# Patient Record
Sex: Male | Born: 1960 | Hispanic: Yes | Marital: Married | Smoking: Never smoker
Health system: Southern US, Community
[De-identification: ages and names within clinical notes are randomized; demographics above are authoritative.]

## PROBLEM LIST (undated history)

## (undated) DIAGNOSIS — J45909 Unspecified asthma, uncomplicated: Secondary | ICD-10-CM

## (undated) HISTORY — PX: ABDOMINAL SURGERY: SHX537

---

## 2017-07-17 DIAGNOSIS — Z905 Acquired absence of kidney: Secondary | ICD-10-CM | POA: Insufficient documentation

## 2019-03-08 ENCOUNTER — Emergency Department (INDEPENDENT_AMBULATORY_CARE_PROVIDER_SITE_OTHER): Payer: BLUE CROSS/BLUE SHIELD

## 2019-03-08 ENCOUNTER — Emergency Department (INDEPENDENT_AMBULATORY_CARE_PROVIDER_SITE_OTHER)
Admission: EM | Admit: 2019-03-08 | Discharge: 2019-03-08 | Disposition: A | Discharge status: Home / Self Care | Payer: BLUE CROSS/BLUE SHIELD | Source: Home / Self Care

## 2019-03-08 ENCOUNTER — Other Ambulatory Visit: Payer: Self-pay

## 2019-03-08 DIAGNOSIS — R05 Cough: Secondary | ICD-10-CM | POA: Diagnosis not present

## 2019-03-08 DIAGNOSIS — R062 Wheezing: Secondary | ICD-10-CM

## 2019-03-08 DIAGNOSIS — J4521 Mild intermittent asthma with (acute) exacerbation: Secondary | ICD-10-CM

## 2019-03-08 HISTORY — DX: Unspecified asthma, uncomplicated: J45.909

## 2019-03-08 MED ORDER — CETIRIZINE HCL 10 MG PO TABS: 10.0000 mg | ORAL_TABLET | Freq: Every day | ORAL | 0 refills | Status: DC

## 2019-03-08 MED ORDER — ALBUTEROL SULFATE HFA 108 (90 BASE) MCG/ACT IN AERS: 1.0000 | INHALATION_SPRAY | Freq: Four times a day (QID) | RESPIRATORY_TRACT | 0 refills | Status: DC | PRN

## 2019-03-08 MED ORDER — PREDNISONE 20 MG PO TABS: ORAL_TABLET | ORAL | 0 refills | Status: DC

## 2019-03-08 MED ORDER — METHYLPREDNISOLONE SODIUM SUCC 40 MG IJ SOLR
80.0000 mg | Freq: Once | INTRAMUSCULAR | Status: AC
  Administered 2019-03-08: 18:00:00 80 mg via INTRAMUSCULAR

## 2019-03-08 NOTE — ED Triage Notes (Signed)
Pt was seen about 1 month ago for cough, given medications for asthma.  About 2 weeks ago became worse.  Denies fever, nausea, vomiting.  Coughs at night mostly, but has gotten worse during the day.  Productive cough

## 2019-03-08 NOTE — ED Provider Notes (Signed)
[facility]    CSN: [id number] Arrival date & time: 03/08/19  1704     History   Chief Complaint Chief Complaint  Patient presents with  . Cough    HPI Frank Burke is a 58 y.o. male.   HPI  Frank Burke is a 58 y.o. male presenting to UC with c/o gradually worsening cough, congestion, chest tightness and wheeze.  He was seen at a different urgent care about 1 month ago and was prescribed prednisone, inhalers and claritin. He was improving until about 2 weeks ago. He ran out of the medication in his inhaler.  Denies fever, chills, HA, sore throat, body aches. Denies n/v/d. No known sick contacts or recent travel. Pt reports needing inhalers around the same time every year due to his allergies.     Past Medical History:  Diagnosis Date  . Asthma     There are no active problems to display for this patient.   History reviewed. No pertinent surgical history.     Home Medications    Prior to Admission medications   Medication Sig Start Date End Date Taking? Authorizing Provider  albuterol (VENTOLIN HFA) 108 (90 Base) MCG/ACT inhaler Inhale 1-2 puffs into the lungs every 6 (six) hours as needed for wheezing or shortness of breath. 03/08/19   Lurene Shadow, PA-C  cetirizine (ZYRTEC) 10 MG tablet Take 1 tablet (10 mg total) by mouth daily. 03/08/19   Lurene Shadow, PA-C  predniSONE (DELTASONE) 20 MG tablet 3 tabs po day one, then 2 po daily x 4 days 03/08/19   Lurene Shadow, PA-C    Family History History reviewed. No pertinent family history.  Social History Social History   Tobacco Use  . Smoking status: Never Smoker  Substance Use Topics  . Alcohol use: Not on file  . Drug use: Not on file     Allergies   Patient has no known allergies.   Review of Systems Review of Systems  Constitutional: Negative for chills and fever.  HENT: Positive for congestion. Negative for ear pain, sore throat, trouble swallowing and voice change.   Respiratory:  Positive for cough, chest tightness, shortness of breath and wheezing.   Cardiovascular: Negative for chest pain and palpitations.  Gastrointestinal: Negative for abdominal pain, diarrhea, nausea and vomiting.  Musculoskeletal: Negative for arthralgias, back pain and myalgias.  Skin: Negative for rash.     Physical Exam Triage Vital Signs ED Triage Vitals  Enc Vitals Group     BP      Pulse      Resp      Temp      Temp src      SpO2      Weight      Height      Head Circumference      Peak Flow      Pain Score      Pain Loc      Pain Edu?      Excl. in GC?    No data found.  Updated Vital Signs BP (!) 139/96 (BP Location: Right Arm)   Pulse 80   Temp (!) 96.9 F (36.1 C) (Tympanic)   Resp 20   SpO2 96%     Physical Exam Vitals signs and nursing note reviewed.  Constitutional:      Appearance: Normal appearance. He is well-developed.  HENT:     Head: Normocephalic and atraumatic.     Right Ear: Tympanic membrane normal.  Left Ear: Tympanic membrane normal.     Nose: Nose normal.     Mouth/Throat:     Lips: Pink.     Mouth: Mucous membranes are moist.     Pharynx: Oropharynx is clear. Uvula midline.  Neck:     Musculoskeletal: Normal range of motion.  Cardiovascular:     Rate and Rhythm: Normal rate and regular rhythm.  Pulmonary:     Effort: Pulmonary effort is normal. No respiratory distress.     Breath sounds: No stridor. Wheezing ( diffuse inspiratory and expiratory) present. No rhonchi.  Musculoskeletal: Normal range of motion.  Skin:    General: Skin is warm and dry.  Neurological:     Mental Status: He is alert and oriented to person, place, and time.  Psychiatric:        Behavior: Behavior normal.      UC Treatments / Results  Labs (all labs ordered are listed, but only abnormal results are displayed) Labs Reviewed - No data to display  EKG None  Radiology Dg Chest 2 View  Result Date: 03/08/2019 CLINICAL DATA:  Cough and  wheezing EXAM: CHEST - 2 VIEW COMPARISON:  None. FINDINGS: The heart size and mediastinal contours are within normal limits. Both lungs are clear. The visualized skeletal structures are unremarkable. IMPRESSION: No active cardiopulmonary disease. Electronically Signed   By: Jasmine PangKim  Fujinaga M.D.   On: 03/08/2019 18:22    Procedures Procedures (including critical care time)  Medications Ordered in UC Medications  methylPREDNISolone sodium succinate (SOLU-MEDROL) 40 mg/mL injection 80 mg (80 mg Intramuscular Given 03/08/19 1745)    Initial Impression / Assessment and Plan / UC Course  I have reviewed the triage vital signs and the nursing notes.  Pertinent labs & imaging results that were available during my care of the patient were reviewed by me and considered in my medical decision making (see chart for details).     Doubt Covid-19 Hx and exam c/w asthma exacerbation Home care info provided Discussed symptoms that warrant emergent care in the ED.  Final Clinical Impressions(s) / UC Diagnoses   Final diagnoses:  Mild intermittent asthma with exacerbation     Discharge Instructions      Your symptoms appear to be due to an exacerbation of your asthma. It is important to start taking the prednisone pills tomorrow with breakfast. You may also take the antihistamine, cetirizine, daily to help with your allergies and asthma.    Please call to establish care with a primary care provider for ongoing healthcare needs including management of your allergies and asthma.  Call 911 or have someone drive you to the hospital if symptoms worsening.     ED Prescriptions    Medication Sig Dispense Auth. Provider   predniSONE (DELTASONE) 20 MG tablet 3 tabs po day one, then 2 po daily x 4 days 11 tablet Delara Shepheard O, PA-C   albuterol (VENTOLIN HFA) 108 (90 Base) MCG/ACT inhaler Inhale 1-2 puffs into the lungs every 6 (six) hours as needed for wheezing or shortness of breath. 1 Inhaler  Waylan RocherPhelps, Jlen Wintle O, PA-C   cetirizine (ZYRTEC) 10 MG tablet Take 1 tablet (10 mg total) by mouth daily. 30 tablet Lurene ShadowPhelps, Samanda Buske O, PA-C     Controlled Substance Prescriptions [place] Controlled Substance Registry consulted? Not Applicable   Rolla Platehelps, Jashiya Bassett O, PA-C 03/08/19 1845

## 2019-03-08 NOTE — Discharge Instructions (Addendum)
°  Your symptoms appear to be due to an exacerbation of your asthma. It is important to start taking the prednisone pills tomorrow with breakfast. You may also take the antihistamine, cetirizine, daily to help with your allergies and asthma.    Please call to establish care with a primary care provider for ongoing healthcare needs including management of your allergies and asthma.  Call 911 or have someone drive you to the hospital if symptoms worsening.

## 2019-05-06 ENCOUNTER — Emergency Department (INDEPENDENT_AMBULATORY_CARE_PROVIDER_SITE_OTHER)
Admission: EM | Admit: 2019-05-06 | Discharge: 2019-05-06 | Disposition: A | Discharge status: Home / Self Care | Payer: BLUE CROSS/BLUE SHIELD | Source: Home / Self Care | Attending: Emergency Medicine | Admitting: Emergency Medicine

## 2019-05-06 ENCOUNTER — Encounter: Payer: Self-pay | Admitting: Emergency Medicine

## 2019-05-06 ENCOUNTER — Other Ambulatory Visit: Payer: Self-pay

## 2019-05-06 ENCOUNTER — Emergency Department (INDEPENDENT_AMBULATORY_CARE_PROVIDER_SITE_OTHER): Payer: BLUE CROSS/BLUE SHIELD

## 2019-05-06 DIAGNOSIS — M542 Cervicalgia: Secondary | ICD-10-CM

## 2019-05-06 DIAGNOSIS — R079 Chest pain, unspecified: Secondary | ICD-10-CM

## 2019-05-06 DIAGNOSIS — M5412 Radiculopathy, cervical region: Secondary | ICD-10-CM

## 2019-05-06 DIAGNOSIS — R0789 Other chest pain: Secondary | ICD-10-CM

## 2019-05-06 NOTE — ED Triage Notes (Signed)
Patient here; using [employer] #[id number]; reports intermittent chest pain radiating to posterior neck for past 5 days since death of his mother. Denies shortness of breath, fever, cough, body aches. No obvious distress today and denies pain currently. He has not travelled past 4 weeks.

## 2019-05-06 NOTE — Discharge Instructions (Signed)
Apply ice pack to your neck twice a day.  Do this for 10 minutes Apply heat to your neck and do stretches of your neck and arms twice a day. Take ibuprofen 2 tablets with each meal. Return to work on Monday. If you develop significant worsening chest pain please go to the emergency room.

## 2019-05-06 NOTE — ED Provider Notes (Signed)
[facility]    CSN: [id number] Arrival date & time: 05/06/19  2119     History   Chief Complaint Chief Complaint  Patient presents with  . Chest Pain  . Neck Pain    HPI Frank Burke is a 58 y.o. male.  Patient was in his usual state of health until Sunday when he learned of his mother's death.  She is currently living in [place].  After he learned of her death he started having pain in the left side of his neck associated with pain in the lateral left pectoral area.  This pain is sharp.  There is no substernal pain or shortness of breath or diaphoresis associated with this.  He does have pain with movement of the left arm and of his neck.  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  He is a non-smoker.  He has a healthy lifestyle with no medical problems. HPI  Past Medical History:  Diagnosis Date  . Asthma     There are no active problems to display for this patient.   History reviewed. No pertinent surgical history.     Home Medications    Prior to Admission medications   Medication Sig Start Date End Date Taking? Authorizing Provider  albuterol (VENTOLIN HFA) 108 (90 Base) MCG/ACT inhaler Inhale 1-2 puffs into the lungs every 6 (six) hours as needed for wheezing or shortness of breath. 03/08/19   Noe Gens, PA-C  cetirizine (ZYRTEC) 10 MG tablet Take 1 tablet (10 mg total) by mouth daily. 03/08/19   Noe Gens, PA-C  predniSONE (DELTASONE) 20 MG tablet 3 tabs po day one, then 2 po daily x 4 days 03/08/19   Noe Gens, PA-C    Family History No family history on file.  Social History Social History   Tobacco Use  . Smoking status: Never Smoker  Substance Use Topics  . Alcohol use: Not on file  . Drug use: Not on file     Allergies   Patient has no known allergies.   Review of Systems Review of Systems  Constitutional: Negative.   HENT: Negative.   Respiratory:       He has a history of asthma but only uses an inhaler  when he has wheezing.  This occurs during allergy season.  Cardiovascular: Positive for chest pain. Negative for palpitations.  Gastrointestinal: Negative.   Endocrine: Negative.   Musculoskeletal: Positive for back pain, neck pain and neck stiffness.  Skin: Negative.   Psychiatric/Behavioral: Negative for dysphoric mood.       He is very sad since the loss of his mother.  He does have support at home.     Physical Exam Triage Vital Signs ED Triage Vitals  Enc Vitals Group     BP 05/06/19 0850 121/81     Pulse Rate 05/06/19 0850 67     Resp 05/06/19 0850 16     Temp 05/06/19 0850 97.9 F (36.6 C)     Temp Source 05/06/19 0850 Oral     SpO2 05/06/19 0850 98 %     Weight 05/06/19 0851 165 lb (74.8 kg)     Height 05/06/19 0851 5\' 8"  (1.727 m)     Head Circumference --      Peak Flow --      Pain Score 05/06/19 0851 0     Pain Loc --      Pain Edu? --      Excl. in [place]? --  No data found.  Updated Vital Signs BP 121/81 (BP Location: Right Arm)   Pulse 67   Temp 97.9 F (36.6 C) (Oral)   Resp 16   Ht 5\' 8"  (1.727 m)   Wt 74.8 kg   SpO2 98%   BMI 25.09 kg/m   Visual Acuity Right Eye Distance:   Left Eye Distance:   Bilateral Distance:    Right Eye Near:   Left Eye Near:    Bilateral Near:     Physical Exam Constitutional:      Appearance: He is normal weight.  Eyes:     Pupils: Pupils are equal, round, and reactive to light.  Neck:     Comments: There is tenderness left side of the neck extending over the left scapula Cardiovascular:     Rate and Rhythm: Normal rate and regular rhythm.     Heart sounds: Normal heart sounds. No friction rub. No gallop.      Comments: There are no murmurs.  PMI is normal. Pulmonary:     Comments: Breath sounds are symmetrical no wheezes. Chest:     Comments: There is significant tenderness over the left pectoralis muscle Abdominal:     Comments: Abdomen is soft with minimal left upper abdominal discomfort.  Skin:     General: Skin is warm and dry.  Neurological:     General: No focal deficit present.     Mental Status: He is alert and oriented to person, place, and time.      UC Treatments / Results  Labs (all labs ordered are listed, but only abnormal results are displayed) Labs Reviewed - No data to display  EKG Sinus bradycardia rate 58 left axis deviation no acute changes.  Radiology No results found.  Procedures Procedures (including critical care time)  Medications Ordered in UC Medications - No data to display  Initial Impression / Assessment and Plan / UC Course  I have reviewed the triage vital signs and the nursing notes. Patient presents with left-sided chest pain and pain left side of his neck.  He works at [employer] and does significant amount of lifting.  His mother died on Sunday which is 5 days ago and that is when the pain began.  He has not been working so the pain has been nonexertional.  This pain certainly could be radiating from his neck or musculoskeletal chest wall pain related to his work.  His symptoms are complicated because he is grieving over the loss of his mother 5 days ago.  The interpreting service was used.  Porfirio MylarCarmen was the interpreter.  EKG done shows left axis deviation sinus bradycardia of 58 no acute changes.  C-spine films done were negative.  Chest x-ray had been done 2 months ago and this was negative. Pertinent labs & imaging results that were available during my care of the patient were reviewed by me and considered in my medical decision making (see chart for details).      Final Clinical Impressions(s) / UC Diagnoses   Final diagnoses:  Neck pain  Cervical radiculopathy  Chest wall pain     Discharge Instructions     Apply ice pack to your neck twice a day.  Do this for 10 minutes Apply heat to your neck and do stretches of your neck and arms twice a day. Take ibuprofen 2 tablets with each meal. Return to work on Monday. If you  develop significant worsening chest pain please go to the emergency room.  ED Prescriptions    None     Controlled Substance Prescriptions [place] Controlled Substance Registry consulted? Not Applicable   Collene Gobbleaub, Erubiel Manasco A, MD 05/06/19 1217

## 2019-05-09 ENCOUNTER — Telehealth: Payer: Self-pay | Admitting: Emergency Medicine

## 2019-11-04 ENCOUNTER — Encounter: Payer: Self-pay | Admitting: *Deleted

## 2019-11-04 ENCOUNTER — Other Ambulatory Visit: Payer: Self-pay

## 2019-11-04 ENCOUNTER — Emergency Department
Admission: EM | Admit: 2019-11-04 | Discharge: 2019-11-04 | Disposition: A | Discharge status: Home / Self Care | Payer: BLUE CROSS/BLUE SHIELD | Source: Home / Self Care

## 2019-11-04 DIAGNOSIS — Z20822 Contact with and (suspected) exposure to covid-19: Secondary | ICD-10-CM

## 2019-11-04 NOTE — Discharge Instructions (Addendum)
We are running Covid test.  Should be back by Monday.   Strategies to prevent and/or treat COVID-19:  Vitamin D3 5000 IU (125 mcg) daily Vitamin C 500 mg twice daily Zinc 50 to 75 mg daily  Listerine type mouthwash 4 times a day

## 2019-11-04 NOTE — ED Provider Notes (Signed)
[facility]    CSN: [id number] Arrival date & time: 11/04/19  0954      History   Chief Complaint Chief Complaint  Patient presents with  . covid test    HPI Frank Burke is a 59 y.o. male.   This is an established current outpatient, 59 years old, who presents for Covid testing.  He denies any symptoms of Covid at the present time.  He needs the test to return to work.  Patient works at [employer] and several of his fellow employees have tested positive for [employer].     Past Medical History:  Diagnosis Date  . Asthma     There are no problems to display for this patient.   No past surgical history on file.     Home Medications    Prior to Admission medications   Not on File    Family History No family history on file.  Social History Social History   Tobacco Use  . Smoking status: Never Smoker  Substance Use Topics  . Alcohol use: Not on file  . Drug use: Not on file     Allergies   Patient has no known allergies.   Review of Systems Review of Systems   Physical Exam Triage Vital Signs ED Triage Vitals  Enc Vitals Group     BP      Pulse      Resp      Temp      Temp src      SpO2      Weight      Height      Head Circumference      Peak Flow      Pain Score      Pain Loc      Pain Edu?      Excl. in GC?    No data found.  Updated Vital Signs BP 139/89 (BP Location: Right Arm)   Pulse 71   Temp 98 F (36.7 C) (Oral)   Resp 16   Wt 75.8 kg   SpO2 98%   BMI 25.39 kg/m    Physical Exam Vitals and nursing note reviewed.  Constitutional:      Appearance: Normal appearance. He is normal weight.  HENT:     Head: Normocephalic.  Eyes:     Conjunctiva/sclera: Conjunctivae normal.  Cardiovascular:     Rate and Rhythm: Normal rate.     Pulses: Normal pulses.  Pulmonary:     Effort: Pulmonary effort is normal.  Musculoskeletal:        General: Normal range of motion.     Cervical back: Normal range  of motion and neck supple.  Skin:    General: Skin is warm and dry.  Neurological:     General: No focal deficit present.     Mental Status: He is alert and oriented to person, place, and time.  Psychiatric:        Mood and Affect: Mood normal.      UC Treatments / Results  Labs (all labs ordered are listed, but only abnormal results are displayed) Labs Reviewed - No data to display  EKG   Radiology No results found.  Procedures Procedures (including critical care time)  Medications Ordered in UC Medications - No data to display  Initial Impression / Assessment and Plan / UC Course  I have reviewed the triage vital signs and the nursing notes.  Pertinent labs & imaging results that were  available during my care of the patient were reviewed by me and considered in my medical decision making (see chart for details).    Final Clinical Impressions(s) / UC Diagnoses   Final diagnoses:  Close exposure to COVID-19 virus     Discharge Instructions     We are running Covid test.  Should be back by Monday.   Strategies to prevent and/or treat COVID-19:  Vitamin D3 5000 IU (125 mcg) daily Vitamin C 500 mg twice daily Zinc 50 to 75 mg daily  Listerine type mouthwash 4 times a day      ED Prescriptions    None     I have reviewed the PDMP during this encounter.   Robyn Haber, MD 11/04/19 1019

## 2019-11-04 NOTE — ED Triage Notes (Signed)
Pt is here today for a COVID test today. Denies s/s. He reports 4 coworkers with COVID.

## 2019-11-06 LAB — NOVEL CORONAVIRUS, NAA: SARS-CoV-2, NAA: NOT DETECTED

## 2020-01-18 ENCOUNTER — Other Ambulatory Visit: Payer: Self-pay

## 2020-01-18 ENCOUNTER — Emergency Department (INDEPENDENT_AMBULATORY_CARE_PROVIDER_SITE_OTHER)
Admission: EM | Admit: 2020-01-18 | Discharge: 2020-01-18 | Disposition: A | Discharge status: Home / Self Care | Payer: BLUE CROSS/BLUE SHIELD | Source: Home / Self Care | Attending: Family Medicine | Admitting: Family Medicine

## 2020-01-18 ENCOUNTER — Encounter: Payer: Self-pay | Admitting: Emergency Medicine

## 2020-01-18 DIAGNOSIS — H539 Unspecified visual disturbance: Secondary | ICD-10-CM | POA: Diagnosis not present

## 2020-01-18 DIAGNOSIS — H9313 Tinnitus, bilateral: Secondary | ICD-10-CM

## 2020-01-18 NOTE — ED Triage Notes (Signed)
Left eye -blurred vision with black shadows, sudden onset at work - denies injury Ringing in bilat ears started w/ seasonal allergies- took claritan this am

## 2020-01-18 NOTE — Discharge Instructions (Addendum)
Rest eyes.  Avoid eye movement.  If symptoms become significantly worse during the night, proceed to the local emergency room.   May use Zyrtec, Allegra, or Claritin for allergy symptoms and ringing in the ears.

## 2020-01-19 ENCOUNTER — Telehealth: Payer: Self-pay

## 2020-01-19 NOTE — Telephone Encounter (Signed)
Left msg for daughter to call back in regards to f/u with Opthalm Specialist. Did leave number to [facility] if she wanted to call them to set up appt, otherwise pls call us back ASAP
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
## 2020-01-19 NOTE — Telephone Encounter (Signed)
Pts daughter returned call. Says they are currently waiting for eye exam at [facility]. Advised pt to discuss with them about referring him to a Opthlam Specialist or Surgeon to fix possible PVD. Given number to [facility] if decide to go there. Pts daughter acknowledged. Advised to call back with any questions.

## 2020-02-06 ENCOUNTER — Other Ambulatory Visit: Payer: Self-pay

## 2020-02-06 ENCOUNTER — Encounter: Payer: Self-pay | Admitting: Family Medicine

## 2020-02-06 ENCOUNTER — Ambulatory Visit (INDEPENDENT_AMBULATORY_CARE_PROVIDER_SITE_OTHER): Payer: BLUE CROSS/BLUE SHIELD | Admitting: Family Medicine

## 2020-02-06 DIAGNOSIS — J4530 Mild persistent asthma, uncomplicated: Secondary | ICD-10-CM | POA: Diagnosis not present

## 2020-02-06 NOTE — Patient Instructions (Signed)
Try changing claritin to cetirizine(zyrtec) Start singulair daily.  Continue albuterol as needed. See me again in about 2 weeks for annual exam and update of lab work.    Asma en los adultos Asthma, Adult  El asma es una afeccin a largo plazo (crnica) en la que las vas respiratorias se Investment banker, corporate y se obstruyen. Las vas respiratorias son los conductos que van desde la Lawyer y la boca hasta los pulmones. Una persona con asma pasar por momentos en que los sntomas empeoran. Estos se conocen como ataques de asma. Pueden causar tos, sonidos agudos al respirar (sibilancias), falta de aire y dolor en el pecho. Esto puede dificultar la respiracin. No hay una cura para el asma, pero los medicamentos y los cambios en el estilo de vida pueden ayudar a Therapist, sports. Hay muchas cosas que pueden provocar un ataque de asma o intensificar los sntomas de la enfermedad (factores desencadenantes). Los factores desencadenantes comunes incluyen los siguientes:  Moho.  Polvo.  Humo del cigarrillo.  Cucarachas.  Cosas que causan sntomas de alergia (alrgenos). Estas incluyen escamas de la piel de los animales (caspa) y el polen de los pastos o los rboles.  Cosas que contaminan el aire. Entre ellas, productos de limpieza del hogar, humo de lea, niebla contaminada u olores qumicos.  Aire fro, cambios climticos y el viento.  Llanto o risa intensos.  Estrs.  Ciertos medicamentos o frmacos.  Ciertos alimentos, como frutas secas, papas fritas y [place].  Infecciones tales como los resfros o la gripe.  Ciertas enfermedades o afecciones.  Ejercicio o actividades extenuantes. El asma se puede tratar con medicamentos y mantenindose alejado de los factores que desencadenan los ataques de asma. Los medicamentos pueden incluir los siguientes:  Medicamentos de control. Estos ayudan a evitar los sntomas de asma. Generalmente, se toman [employer].  Medicamentos de [place] o de rescate de  accin rpida. Estos alivian los sntomas de asma rpidamente. Se utilizan cuando es necesario y proporcionan alivio a Control and instrumentation engineer.  Medicamentos para alergias si los ataques son ocasionados por alrgenos.  Medicamentos para ayudar a Event organiser de defensa (inmunitario) del organismo. Siga estas instrucciones en su casa: Cmo evitar desencadenantes en su hogar  Cambie el filtro de la calefaccin y del aire acondicionado con frecuencia.  Limite el uso de hogares o estufas a lea.  Elimine las plagas (como cucarachas, ratones) y sus excrementos.  Deseche las plantas si observa moho en ellas.  Limpie los pisos. Elimine el polvo regularmente. Use productos de limpieza que sean inodoros.  Pdale a alguien que pase la aspiradora cuando usted no se encuentre en casa. Utilice una aspiradora con un filtro de aire de alta eficiencia (HEPA), siempre que sea posible.  Reemplace las alfombras por pisos de [place], baldosas o vinilo. Las alfombras pueden retener las escamas de la piel de los animales y [place].  Use almohadas, mantas y Government social research officer.  [place] sbanas y las mantas todas las semanas con agua caliente. Squelas en [place] R[place].  Mantenga su habitacin libre de desencadenantes.  Evite las [employer] y [employer] ventanas cerradas cuando en el aire haya cosas que causen sntomas de [place].  Use mantas de polister o algodn.  Limpie baos y cocinas con lavandina. Si fuera posible, pdale a alguien que vuelva a pintar las paredes de estas habitaciones con Ardelia Mems pintura resistente a los hongos. Aljese de las habitaciones que se estn limpiando y pintando.  Lvese las manos frecuentemente con agua y [place].  Use desinfectante para manos si no dispone de [place] y [place].  No permita que nadie fume en su casa. Instrucciones generales  Use los medicamentos de venta libre y los recetados solamente como se lo haya indicado el mdico. ? Hable con el mdico si tiene  preguntas sobre cmo y cundo tomar sus medicamentos. ? Tome nota si necesita tomar sus medicamentos con ms frecuencia que lo usual.  No consuma ningn producto que contenga nicotina o tabaco, como cigarrillos y cigarrillos electrnicos. Si necesita ayuda para dejar de consumir, consulte al mdico.  Aljese del humo de otros fumadores.  Evite realizar actividades al aire libre cuando la cantidad de alrgenos sea alta y la calidad del aire sea baja.  Use un pasamontaas al hacer actividades al [place] en invierno. Este debe cubrir la nariz y la boca. [address] fros, ejerctese en interiores, de ser posible.  Entre en calor antes de hacer ejercicio. Dedique tiempo a enfriarse luego de Arts development officer fsicas.  Use un espirmetro como se lo haya indicado el mdico. El espirmetro es una herramienta que mide el funcionamiento de los pulmones.  Lleve un registro de los valores del espirmetro. Antelos.  Siga el plan de accin para el asma. Este es una planificacin por escrito para el control del asma y el tratamiento de los ataques.  Asegrese de recibir todas las vacunas que el mdico le recomiende. Consulte al [employer] vacunas para la gripe y la neumona.  Concurra a todas las visitas de [address] se lo haya indicado el mdico. Esto es importante. Comunquese con un mdico si:  Tiene sibilancias, respira con dificultad o tose aun cuando toma medicamentos para prevenir ataques.  La mucosidad que expectora cuando tose (esputo) es ms espesa que lo habitual.  La mucosidad que expectora cambia de trasparente o blanco a amarillo, verde, gris o sanguinolenta.  Tiene trastornos a causa de los [employer] toma, por ejemplo: ? Erupcin cutnea. ? Picazn. ? Hinchazn. ? Dificultad para respirar.  Necesita un medicamento de alivio ms de 2 a 3veces por semana.  El valor de flujo mximo an est entre el 50 y el 79% del Arts administrator personal despus de  seguir el plan de accin durante 1hora.  Tiene fiebre. Solicite ayuda inmediatamente si:  Advertising account executive y no responde al [employer] durante un ataque de asma.  Le falta el aire, incluso en reposo.  Le falta el aire incluso cuando hace muy poca actividad fsica.  Tiene dificultad para comer, beber o hablar.  Siente dolor u opresin en el pecho.  Tiene latidos cardacos acelerados.  Los labios o las uas se le tornan [place].  Siente que est por desvanecerse, est mareado o se desmaya.  Su flujo mximo es menor que el 50% del Arts administrator personal.  Se siente demasiado cansado para respirar con normalidad. Resumen  El asma es una afeccin a largo plazo (crnica) en la que las vas respiratorias se Engineer, technical sales y se obstruyen. El ataque de asma puede causar dificultad para respirar.  El asma no puede curarse, pero los [employer] y los cambios en el estilo de vida lo ayudarn a Scientist, physiological enfermedad.  Asegrese de comprender cmo evitar los desencadenantes y cmo y [employer]. Esta informacin no tiene Theme park manager el consejo del mdico. Asegrese de hacerle al mdico cualquier pregunta que tenga. Document Revised: 01/17/2019 Document Reviewed: 05/13/2017 Elsevier Patient Education  2020 [employer].

## 2020-02-07 ENCOUNTER — Telehealth: Payer: Self-pay

## 2020-02-07 ENCOUNTER — Encounter: Payer: Self-pay | Admitting: Family Medicine

## 2020-02-07 DIAGNOSIS — J4530 Mild persistent asthma, uncomplicated: Secondary | ICD-10-CM | POA: Insufficient documentation

## 2020-02-07 DIAGNOSIS — R062 Wheezing: Secondary | ICD-10-CM

## 2020-02-07 MED ORDER — MONTELUKAST SODIUM 10 MG PO TABS: 10.0000 mg | ORAL_TABLET | Freq: Every day | ORAL | 3 refills | Status: AC

## 2020-02-07 MED ORDER — ALBUTEROL SULFATE HFA 108 (90 BASE) MCG/ACT IN AERS: INHALATION_SPRAY | RESPIRATORY_TRACT | 2 refills | Status: AC

## 2020-02-07 NOTE — Telephone Encounter (Signed)
*  The following conversation took place in spanish, as patient does not speak english*  Patient's wife called stating patient needed medications sent to pharmacy. He was seen yesterday by Dr Ashley Royalty but did not get albuterol nor Singulair sent to pharmacy. Patient's wife reports he is wheezing and has a productive cough.   I advised wife I would make sure medications were sent to pharmacy and advised her to take patient to hospital for evaluation if symptoms worsen or if patient has chest pain or difficulty breathing.   Les Pou, provider is out of office this afternoon. Can you please send RXs that were pended?
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
## 2020-02-07 NOTE — Telephone Encounter (Signed)
Prescriptions approved and sent to pharmacy.

## 2020-02-07 NOTE — Assessment & Plan Note (Signed)
Symptoms consistent with asthma.  Reports that he has plenty of albuterol.  Will add additional refills.  Will add on singulair as chronic allergies are not well controlled either..  Can add on controller inhaler if not improving with this.  Recommend change from loratadine to cetirizine. Return in about 2 weeks (around 02/20/2020) for Annual Exam/Labs-Please schedule on-site interpreter.

## 2020-02-14 ENCOUNTER — Telehealth: Payer: Self-pay

## 2020-02-14 NOTE — Telephone Encounter (Signed)
Patient walked in and wanted to be seen in office. States that we told him to come in. Spoke with team lead, rachel, and she advised patient to go to our UC or ER, she did not advise him to come in our off. Patient came in stating that he was having palpations and chest pain. Advised him again to go to our UC or ER. Patient declined and walked away.

## 2020-02-14 NOTE — Telephone Encounter (Signed)
Can you get this set up?   Interpreter needs to be through [facility]- not someone at home

## 2020-02-14 NOTE — Telephone Encounter (Signed)
Can we do a virtual visit with an interpreter?

## 2020-02-14 NOTE — Telephone Encounter (Signed)
**  following conversation took place in [place]**  Patient calls with complaints of chest discomfort and productive cough. Had an appointment with Dr Ashley Royalty on 02/06/20 and was given inhaler and singulair but states no improvement. I advised patient to be seen at urgent care in person and patient refused stated he has been to urgent care several times and is not being treated for his symptoms/no improvement.   Please advise.Frank Burke

## 2020-02-20 ENCOUNTER — Encounter: Payer: BLUE CROSS/BLUE SHIELD | Admitting: Family Medicine

## 2021-06-01 IMAGING — DX CERVICAL SPINE - COMPLETE 4+ VIEW
6 series · 6 of 6 positions shown · non-contrast
Comparison: None.

CLINICAL DATA: Chest pain radiating into back of neck since [REDACTED]

EXAM:
CERVICAL SPINE - COMPLETE 4+ VIEW

[c-spine lat]
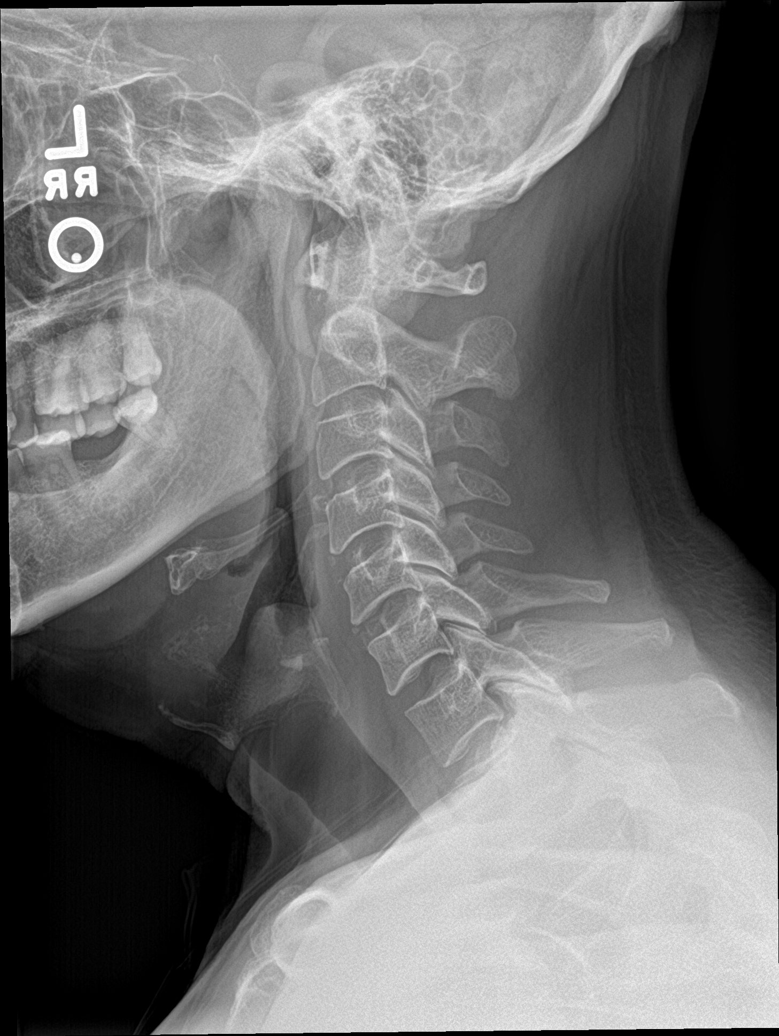

[c-spine obl (1 of 2)]
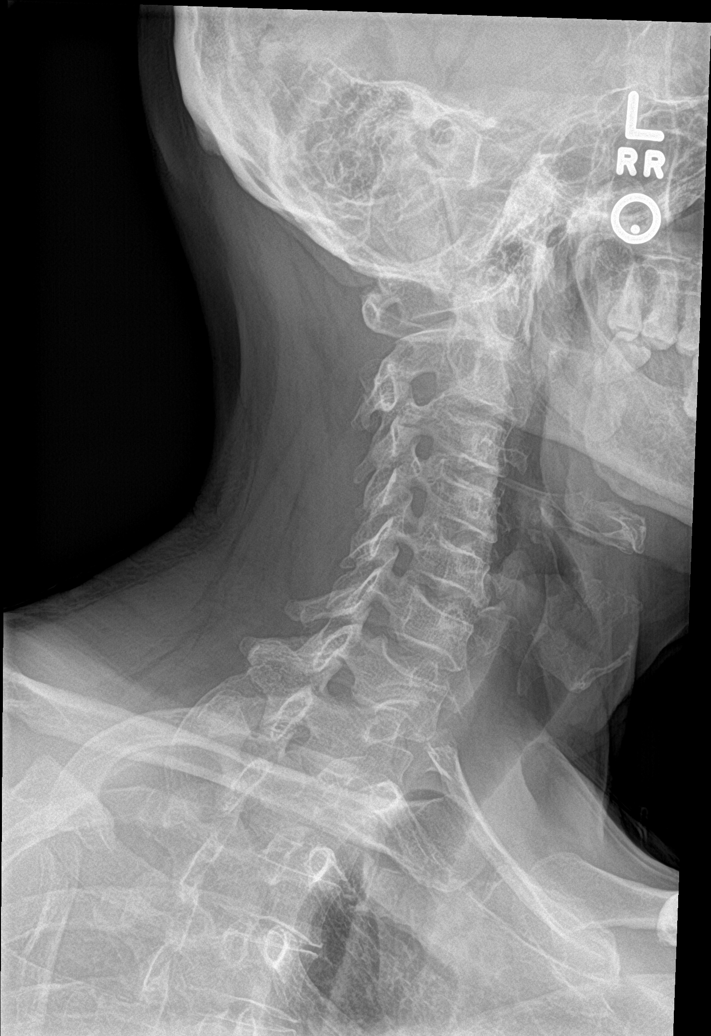

[c-spine obl (2 of 2)]
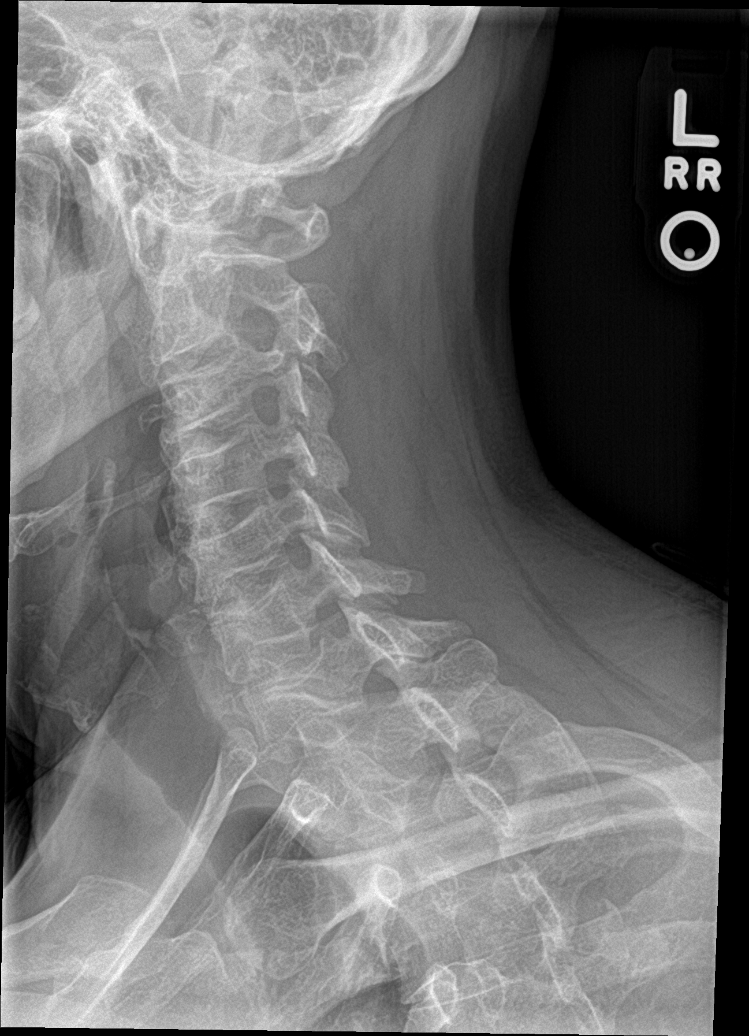

[c-spine ap]
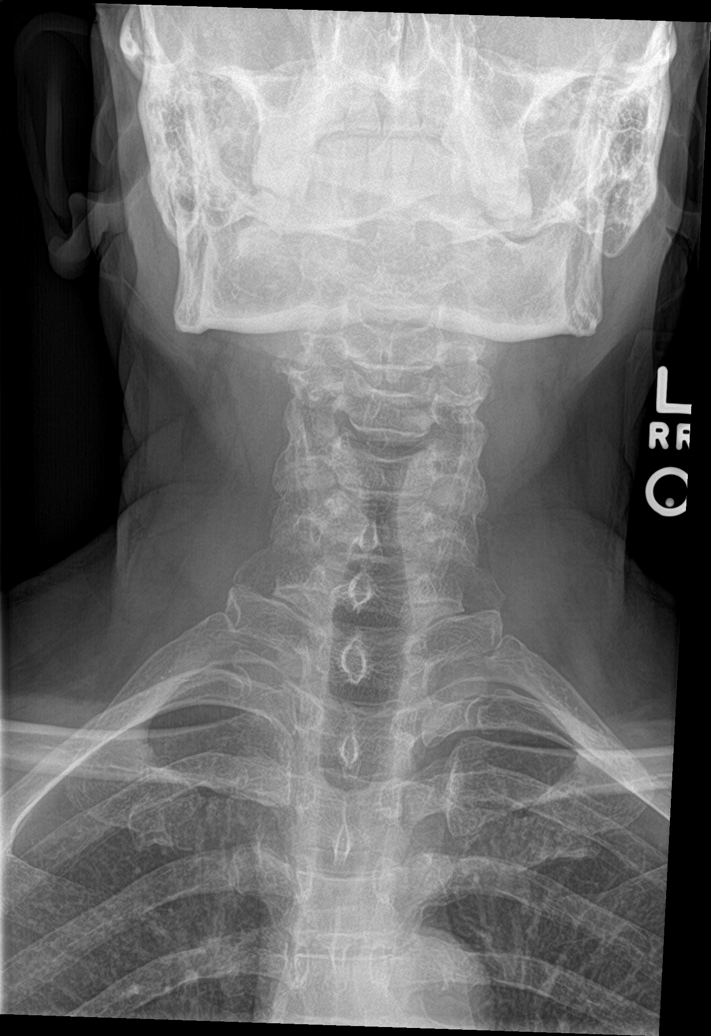

[c-spine open mouth (1 of 2)]
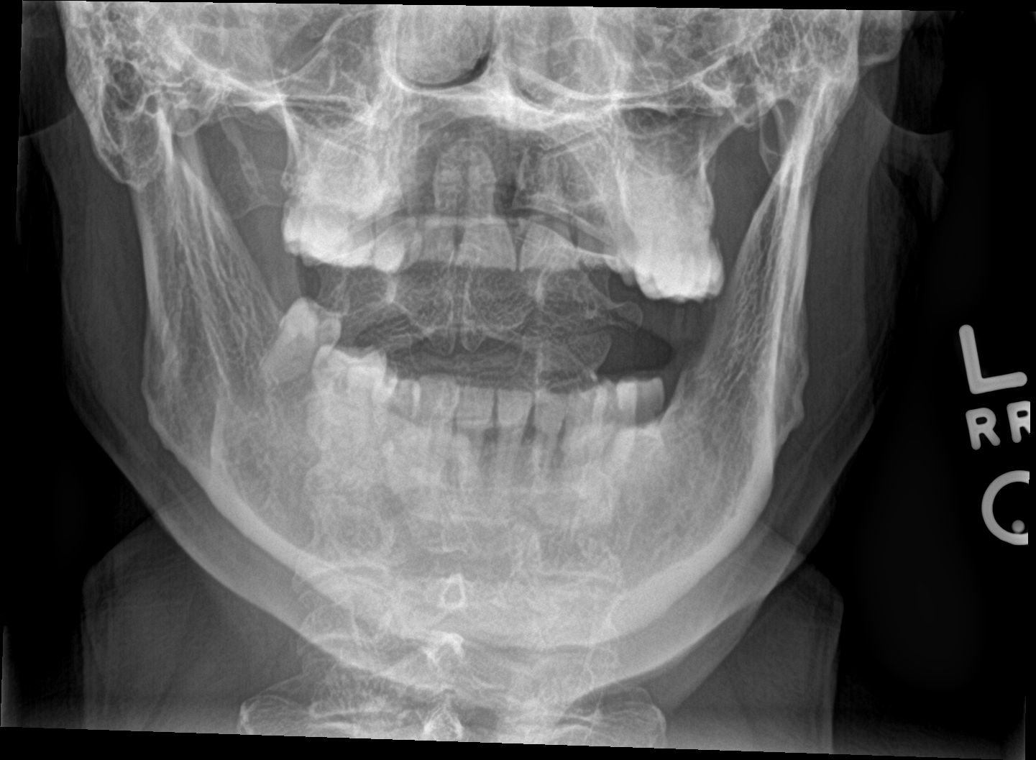

[c-spine open mouth (2 of 2)]
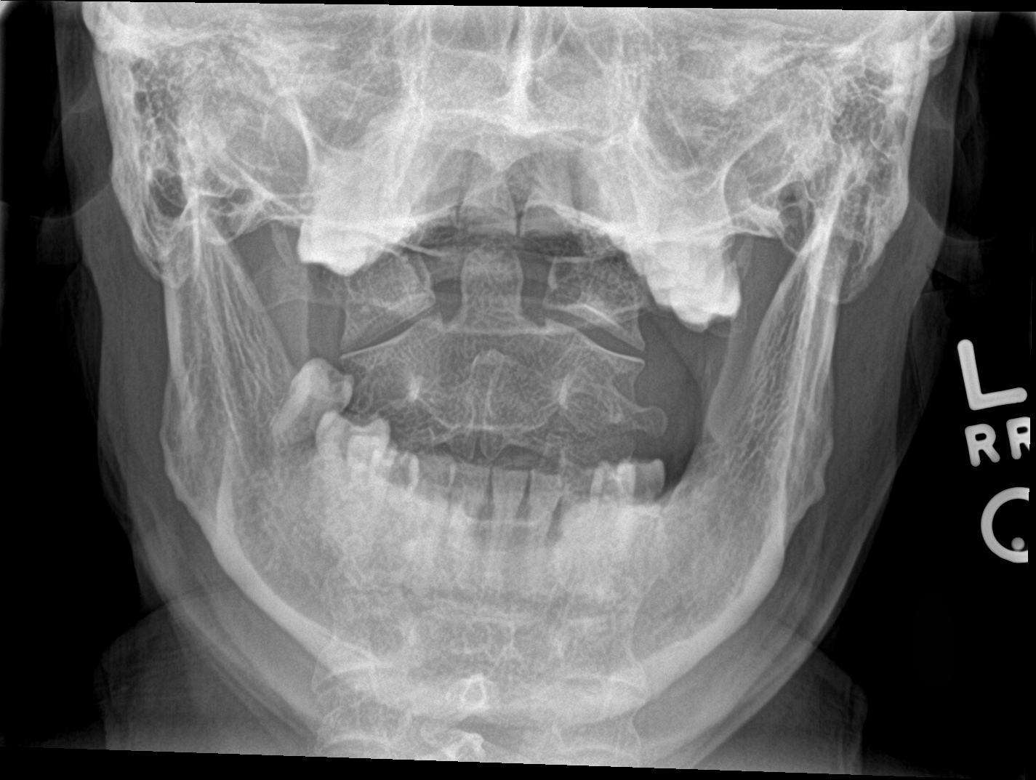

[6 of 6 positions shown; findings below may reference images not displayed]

FINDINGS: There is no evidence of cervical spine fracture or prevertebral soft
tissue swelling. Alignment is normal. No other significant bone
abnormalities are identified.
IMPRESSION: Negative cervical spine radiographs.
